# Patient Record
Sex: Female | Born: 1980 | Race: White | Hispanic: No | Marital: Single
Health system: Southern US, Community
[De-identification: ages and names within clinical notes are randomized; demographics above are authoritative.]

---
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
## 2012-04-18 ENCOUNTER — Emergency Department (INDEPENDENT_AMBULATORY_CARE_PROVIDER_SITE_OTHER)
Admission: EM | Admit: 2012-04-18 | Discharge: 2012-04-18 | Disposition: A | Discharge status: Home / Self Care | Payer: Self-pay | Source: Home / Self Care | Attending: Emergency Medicine | Admitting: Emergency Medicine

## 2012-04-18 ENCOUNTER — Encounter (HOSPITAL_COMMUNITY): Payer: Self-pay

## 2012-04-18 DIAGNOSIS — R197 Diarrhea, unspecified: Secondary | ICD-10-CM

## 2012-04-18 DIAGNOSIS — R1084 Generalized abdominal pain: Secondary | ICD-10-CM

## 2012-04-18 LAB — CBC WITH DIFFERENTIAL/PLATELET
Basophils Absolute: 0 10*3/uL (ref 0.0–0.1)
Eosinophils Absolute: 0.2 10*3/uL (ref 0.0–0.7)
Eosinophils Relative: 3 % (ref 0–5)
Lymphocytes Relative: 38 % (ref 12–46)
Lymphs Abs: 1.8 10*3/uL (ref 0.7–4.0)
MCV: 83.8 fL (ref 78.0–100.0)
Neutrophils Relative %: 51 % (ref 43–77)
Platelets: 209 10*3/uL (ref 150–400)
RBC: 4.58 MIL/uL (ref 3.87–5.11)
RDW: 12.5 % (ref 11.5–15.5)
WBC: 4.8 10*3/uL (ref 4.0–10.5)

## 2012-04-18 LAB — POCT URINALYSIS DIP (DEVICE)
Hgb urine dipstick: NEGATIVE
Ketones, ur: NEGATIVE mg/dL
Protein, ur: NEGATIVE mg/dL
Specific Gravity, Urine: 1.02 (ref 1.005–1.030)
Urobilinogen, UA: 0.2 mg/dL (ref 0.0–1.0)
pH: 6 (ref 5.0–8.0)

## 2012-04-18 LAB — POCT I-STAT, CHEM 8
BUN: 9 mg/dL (ref 6–23)
Calcium, Ion: 1.24 mmol/L — ABNORMAL HIGH (ref 1.12–1.23)
Creatinine, Ser: 0.8 mg/dL (ref 0.50–1.10)
Hemoglobin: 13.6 g/dL (ref 12.0–15.0)
Sodium: 140 mEq/L (ref 135–145)
TCO2: 25 mmol/L (ref 0–100)

## 2012-04-18 LAB — COMPREHENSIVE METABOLIC PANEL
AST: 15 U/L (ref 0–37)
BUN: 10 mg/dL (ref 6–23)
CO2: 28 mEq/L (ref 19–32)
Calcium: 9.3 mg/dL (ref 8.4–10.5)
Chloride: 103 mEq/L (ref 96–112)
Creatinine, Ser: 0.66 mg/dL (ref 0.50–1.10)
GFR calc Af Amer: >90 mL/min (ref >90)
GFR calc non Af Amer: >90 mL/min (ref >90)
Glucose, Bld: 84 mg/dL (ref 70–99)
Total Bilirubin: 0.3 mg/dL (ref 0.3–1.2)

## 2012-04-18 MED ORDER — DICYCLOMINE HCL 10 MG PO CAPS: 10.0000 mg | ORAL_CAPSULE | Freq: Three times a day (TID) | ORAL | Status: AC

## 2012-04-18 MED ORDER — DIPHENOXYLATE-ATROPINE 2.5-0.025 MG PO TABS: 1.0000 | ORAL_TABLET | Freq: Four times a day (QID) | ORAL | Status: AC | PRN

## 2012-04-18 NOTE — ED Provider Notes (Addendum)
History     CSN: [id number]  Arrival date & time 04/18/12  1007   First MD Initiated Contact with Patient 04/18/12 1007      Chief Complaint  Patient presents with  . Abdominal Pain    (Consider location/radiation/quality/duration/timing/severity/associated sxs/prior treatment) HPI Comments: Patient presents urgent care complaining of ongoing abdominal cramping pains for about 7 days. She has also been experiencing diarrhea range from soft to liquid stools. Occasionally she does see phlegm or mucoid material denies any blood or rectal pain. Patient feels at this point after 7 days of symptoms she might have  lost a couple pounds. Feels fearful of eating certain foods as she doesn't want to perpetuate or aggravated the diarrhea or the cramps. So she has not been eating very well for the last few days. Patient denies any fevers, recent antibiotic usage, have not traveled internationally further last 6 months. She admits that she have noticed similar instances in which she had had some diarrheas occasionally within the last few months. Denies any bloody diarrhea as and does not recall if she has had cramping pains associated with it. Patient denies any urinary symptoms, vaginal discharge nor expresses any further concerns. Denies any constitutional symptoms such as fevers, malaise, chills, arthralgias, myalgias,  Patient is a 31 y.o. female presenting with abdominal pain. The history is provided by the patient.  Abdominal Pain The primary symptoms of the illness include abdominal pain and diarrhea. The primary symptoms of the illness do not include fever, fatigue, nausea, vomiting, hematemesis, hematochezia, dysuria, vaginal discharge or vaginal bleeding. The current episode started more than 2 days ago. The problem has not changed since onset. The abdominal pain began more than 2 days ago. The abdominal pain has been unchanged since its onset. The abdominal pain is generalized. The abdominal pain  does not radiate. The severity of the abdominal pain is 7/10. The abdominal pain is relieved by nothing.  The illness is associated with eating (SOMETIMES). The patient states that she believes she is currently not pregnant. The patient has had a change in bowel habit. Additional symptoms associated with the illness include anorexia and frequency. Symptoms associated with the illness do not include chills, constipation, urgency or back pain. Significant associated medical issues do not include GERD, inflammatory bowel disease, diabetes, gallstones, liver disease, substance abuse, diverticulitis or cardiac disease.    History reviewed. No pertinent past medical history.  History reviewed. No pertinent past surgical history.  History reviewed. No pertinent family history.  History  Substance Use Topics  . Smoking status: Not on file  . Smokeless tobacco: Not on file  . Alcohol Use: Not on file    OB History    Grav Para Term Preterm Abortions TAB SAB Ect Mult Living                  Review of Systems  Constitutional: Negative for fever, chills, activity change and fatigue.  Gastrointestinal: Positive for abdominal pain, diarrhea and anorexia. Negative for nausea, vomiting, constipation, blood in stool, hematochezia, abdominal distention and hematemesis.  Genitourinary: Positive for frequency. Negative for dysuria, urgency, vaginal bleeding and vaginal discharge.  Musculoskeletal: Negative for back pain.  Skin: Negative for rash.  Neurological: Negative for dizziness.    Allergies  Review of patient's allergies indicates no known allergies.  Home Medications   Current Outpatient Rx  Name Route Sig Dispense Refill  . DIPHENOXYLATE-ATROPINE 2.5-0.025 MG PO TABS Oral Take 1 tablet by mouth 4 (four) times daily as  needed for diarrhea or loose stools. 15 tablet 0    BP 127/79  Pulse 62  Temp 98.8 F (37.1 C) (Oral)  Resp 18  SpO2 99%  Physical Exam  Nursing note and vitals  reviewed. Constitutional: Vital signs are normal. She appears well-developed and well-nourished.  Non-toxic appearance. She does not have a sickly appearance. She appears ill. No distress.  Eyes: No scleral icterus.  Pulmonary/Chest: Effort normal.  Abdominal: Soft. Bowel sounds are normal. She exhibits no distension and no mass. There is no hepatosplenomegaly or hepatomegaly. There is no tenderness. There is no rigidity, no rebound, no guarding, no CVA tenderness, no tenderness at McBurney's point and negative Murphy's sign.    Skin: Skin is warm. No rash noted. No erythema.    ED Course  Procedures (including critical care time)  Labs Reviewed  POCT I-STAT, CHEM 8 - Abnormal; Notable for the following:    Calcium, Ion 1.24 (*)     All other components within normal limits  POCT URINALYSIS DIP (DEVICE)  POCT PREGNANCY, URINE  COMPREHENSIVE METABOLIC PANEL  LIPASE, BLOOD  CBC WITH DIFFERENTIAL  STOOL CULTURE   No results found.   1. Abdominal pain, generalized   2. Diarrhea       MDM  Patient presents today with gastrointestinal symptoms that consist of intermittent recurrent abdominal cramping pains (colickly in nature). Nonbloody diarrheas, with a normal abdominal exam. Patient relates during exam and interview that she does not feel any pain at all during evaluation. Patient might have a numerous conditions from inflammatory bowel illness, functional (IBS) process or gastroenteritis We have done some preliminary laboratory workup that included stool studies to rule out infectious processes and electrolyte disturbances. We will treat patient symptomatically for the next 5-7 days and had been encouraged to followup with the gastroenterologist a number was provided to her urine or encounter for her to call in case her symptoms persisted. We also discussed symptoms that will warrant further evaluation in the emergency department .she agrees with treatment plan and followup care as  necessary. I have advised her that we will contact her if any abnormal test results.      Jimmie Molly, MD 04/18/12 1158  Jimmie Molly, MD 04/18/12 1610  Jimmie Molly, MD 04/18/12 1209

## 2012-04-18 NOTE — ED Notes (Signed)
C/o 1 week general abdominal pain , cramps, nausea; denies vomiting, diarrhea; pain comes and goes (currently "1" , can be as much as "10" at times) NAD at present, denies STD concerns

## 2012-04-22 LAB — STOOL CULTURE

## 2020-01-02 ENCOUNTER — Ambulatory Visit: Payer: Self-pay | Attending: Internal Medicine

## 2020-01-02 DIAGNOSIS — Z23 Encounter for immunization: Secondary | ICD-10-CM

## 2020-01-02 NOTE — Progress Notes (Signed)
   Covid-19 Vaccination Clinic  Name:  Virjean Boman    MRN: [id number] DOB: Oct 16, 1980  01/02/2020  Ms. Mander was observed post Covid-19 immunization for 15 minutes without incident. She was provided with Vaccine Information Sheet and instruction to access the V-Safe system.   Ms. Alfonzo was instructed to call 911 with any severe reactions post vaccine: Marland Kitchen Difficulty breathing  . Swelling of face and throat  . A fast heartbeat  . A bad rash all over body  . Dizziness and weakness   Immunizations Administered    Name Date Dose VIS Date Route   Pfizer COVID-19 Vaccine 01/02/2020 10:30 AM 0.3 mL 10/07/2018 Intramuscular   Manufacturer: [employer], [employer]   Lot: [id number]   NDC: [address]-6

## 2020-01-25 ENCOUNTER — Ambulatory Visit: Payer: Self-pay | Attending: Internal Medicine

## 2020-01-25 DIAGNOSIS — Z23 Encounter for immunization: Secondary | ICD-10-CM

## 2020-01-25 NOTE — Progress Notes (Signed)
° °  Covid-19 Vaccination Clinic  Name:  Carmyn Hamm    MRN: [id number] DOB: 29-Nov-1980  01/25/2020  Ms. Rivest was observed post Covid-19 immunization for 15 minutes without incident. She was provided with Vaccine Information Sheet and instruction to access the V-Safe system.   Ms. Nordmeyer was instructed to call 911 with any severe reactions post vaccine:  Difficulty breathing   Swelling of face and throat   A fast heartbeat   A bad rash all over body   Dizziness and weakness   Immunizations Administered    Name Date Dose VIS Date Route   Pfizer COVID-19 Vaccine 01/25/2020  8:54 AM 0.3 mL 10/07/2018 Intramuscular   Manufacturer: [employer], [employer]   Lot: [id number]   NDC: [address]-5

## 2020-02-17 ENCOUNTER — Other Ambulatory Visit: Payer: Self-pay

## 2020-02-17 ENCOUNTER — Ambulatory Visit (INDEPENDENT_AMBULATORY_CARE_PROVIDER_SITE_OTHER): Payer: No Payment, Other | Admitting: Clinical

## 2020-02-17 DIAGNOSIS — F330 Major depressive disorder, recurrent, mild: Secondary | ICD-10-CM | POA: Diagnosis not present

## 2020-02-20 DIAGNOSIS — F330 Major depressive disorder, recurrent, mild: Secondary | ICD-10-CM | POA: Insufficient documentation
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
## 2020-02-24 ENCOUNTER — Other Ambulatory Visit: Payer: Self-pay

## 2020-02-24 ENCOUNTER — Telehealth (HOSPITAL_COMMUNITY): Payer: BLUE CROSS/BLUE SHIELD | Admitting: Psychiatric/Mental Health

## 2021-02-13 ENCOUNTER — Ambulatory Visit (INDEPENDENT_AMBULATORY_CARE_PROVIDER_SITE_OTHER): Payer: Self-pay

## 2021-02-13 ENCOUNTER — Ambulatory Visit
Admission: RE | Admit: 2021-02-13 | Discharge: 2021-02-13 | Disposition: A | Discharge status: Home / Self Care | Payer: Self-pay | Source: Ambulatory Visit | Attending: Emergency Medicine | Admitting: Emergency Medicine

## 2021-02-13 ENCOUNTER — Other Ambulatory Visit: Payer: Self-pay

## 2021-02-13 VITALS — BP 113/61 | HR 77 | Temp 98.5°F | Resp 20

## 2021-02-13 DIAGNOSIS — S60221A Contusion of right hand, initial encounter: Secondary | ICD-10-CM

## 2021-02-13 DIAGNOSIS — S63501A Unspecified sprain of right wrist, initial encounter: Secondary | ICD-10-CM

## 2021-02-13 DIAGNOSIS — M79641 Pain in right hand: Secondary | ICD-10-CM

## 2021-02-13 MED ORDER — IBUPROFEN 800 MG PO TABS: 800.0000 mg | ORAL_TABLET | Freq: Three times a day (TID) | ORAL | 0 refills | Status: AC

## 2021-02-13 NOTE — Discharge Instructions (Addendum)
Xray negative for fracture Use anti-inflammatories for pain/swelling. You may take up to 800 mg Ibuprofen every 8 hours with food. You may supplement Ibuprofen with Tylenol [phone] mg every 8 hours.  Ice Brace to limit movement at wrist Follow up if not improving or worsening

## 2021-02-13 NOTE — ED Provider Notes (Signed)
[facility]    CSN: [id number] Arrival date & time: 02/13/21  1043      History   Chief Complaint Chief Complaint  Patient presents with   Appointment    1100   Hand Pain    HPI Kelly Palmer is a 40 y.o. female presenting today for right hand injury.  Reports crushing hand between cart and counter 2 days ago.  Since has had pain radiating into fingers and forearm.  Pain overall has improved, but continues to have discomfort.  Denies prior fracture.  HPI  History reviewed. No pertinent past medical history.  Patient Active Problem List   Diagnosis Date Noted   Major depressive disorder, recurrent episode, mild (HCC) 02/20/2020    History reviewed. No pertinent surgical history.  OB History   No obstetric history on file.      Home Medications    Prior to Admission medications   Medication Sig Start Date End Date Taking? Authorizing Provider  ibuprofen (ADVIL) 800 MG tablet Take 1 tablet (800 mg total) by mouth 3 (three) times daily. 02/13/21  Yes Costella Schwarz C, PA-C  dicyclomine (BENTYL) 10 MG capsule Take 1 capsule (10 mg total) by mouth 4 (four) times daily -  before meals and at bedtime. 04/18/12 04/18/13  Kelly Molly, MD    Family History Family History  Family history unknown: Yes    Social History     Allergies   Patient has no known allergies.   Review of Systems Review of Systems  Constitutional:  Negative for fatigue and fever.  HENT:  Negative for mouth sores.   Eyes:  Negative for visual disturbance.  Respiratory:  Negative for shortness of breath.   Cardiovascular:  Negative for chest pain.  Gastrointestinal:  Negative for abdominal pain, nausea and vomiting.  Genitourinary:  Negative for genital sores.  Musculoskeletal:  Positive for joint swelling. Negative for arthralgias.  Skin:  Positive for color change. Negative for rash and wound.  Neurological:  Negative for dizziness, weakness, light-headedness and headaches.     Physical Exam Triage Vital Signs ED Triage Vitals [02/13/21 1052]  Enc Vitals Group     BP      Pulse      Resp      Temp      Temp src      SpO2      Weight      Height      Head Circumference      Peak Flow      Pain Score 6     Pain Loc      Pain Edu?      Excl. in GC?    No data found.  Updated Vital Signs BP 113/61 (BP Location: Left Arm)   Pulse 77   Temp 98.5 F (36.9 C) (Oral)   Resp 20   SpO2 96%   Visual Acuity Right Eye Distance:   Left Eye Distance:   Bilateral Distance:    Right Eye Near:   Left Eye Near:    Bilateral Near:     Physical Exam Vitals and nursing note reviewed.  Constitutional:      Appearance: She is well-developed.     Comments: No acute distress  HENT:     Head: Normocephalic and atraumatic.     Nose: Nose normal.  Eyes:     Conjunctiva/sclera: Conjunctivae normal.  Cardiovascular:     Rate and Rhythm: Normal rate.  Pulmonary:  Effort: Pulmonary effort is normal. No respiratory distress.  Abdominal:     General: There is no distension.  Musculoskeletal:        General: Normal range of motion.     Cervical back: Neck supple.     Comments: Right wrist/hand: Mild swelling and distal forearm/wrist and dorsum of hand compared to left, tenderness to palpation along distal radius extending into carpal area and second through fourth metacarpals, no snuffbox tenderness, pain more focal over third metacarpal Radial pulse 2+, sensation intact distally  Skin:    General: Skin is warm and dry.  Neurological:     Mental Status: She is alert and oriented to person, place, and time.     UC Treatments / Results  Labs (all labs ordered are listed, but only abnormal results are displayed) Labs Reviewed - No data to display  EKG   Radiology DG Hand Complete Right  Result Date: 02/13/2021 CLINICAL DATA:  Blunt trauma EXAM: RIGHT HAND - COMPLETE 3+ VIEW COMPARISON:  None. FINDINGS: No evidence of fracture of the carpal or  metacarpal bones. Radiocarpal joint is intact. Phalanges are normal. No soft tissue injury. IMPRESSION: No fracture or dislocation. Electronically Signed   By: Genevive Bi M.D.   On: 02/13/2021 11:27    Procedures Procedures (including critical care time)  Medications Ordered in UC Medications - No data to display  Initial Impression / Assessment and Plan / UC Course  I have reviewed the triage vital signs and the nursing notes.  Pertinent labs & imaging results that were available during my care of the patient were reviewed by me and considered in my medical decision making (see chart for details).     Right hand contusion/right wrist sprain-x-ray negative for fracture, placing in wrist brace to limit movement at wrist and recommending anti-inflammatories ice and monitor for gradual resolution return to normal use of right hand and wrist.  No snuffbox tenderness, do not suspect scaphoid fracture.  Follow-up if not improving or worsening.  Discussed strict return precautions. Patient verbalized understanding and is agreeable with plan.  Final Clinical Impressions(s) / UC Diagnoses   Final diagnoses:  Contusion of right hand, initial encounter  Sprain of right wrist, initial encounter     Discharge Instructions      Xray negative for fracture Use anti-inflammatories for pain/swelling. You may take up to 800 mg Ibuprofen every 8 hours with food. You may supplement Ibuprofen with Tylenol [phone] mg every 8 hours.  Ice Brace to limit movement at wrist Follow up if not improving or worsening     ED Prescriptions     Medication Sig Dispense Auth. Provider   ibuprofen (ADVIL) 800 MG tablet Take 1 tablet (800 mg total) by mouth 3 (three) times daily. 21 tablet Shaneese Tait, [place] C, PA-C      PDMP not reviewed this encounter.   Lew Dawes, PA-C 02/13/21 1139
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
## 2022-07-16 IMAGING — DX DG HAND COMPLETE 3+V*R*
3 series · 3 of 3 positions shown · non-contrast
Comparison: None.

CLINICAL DATA: Blunt trauma

EXAM:
RIGHT HAND - COMPLETE 3+ VIEW

[hand pa]
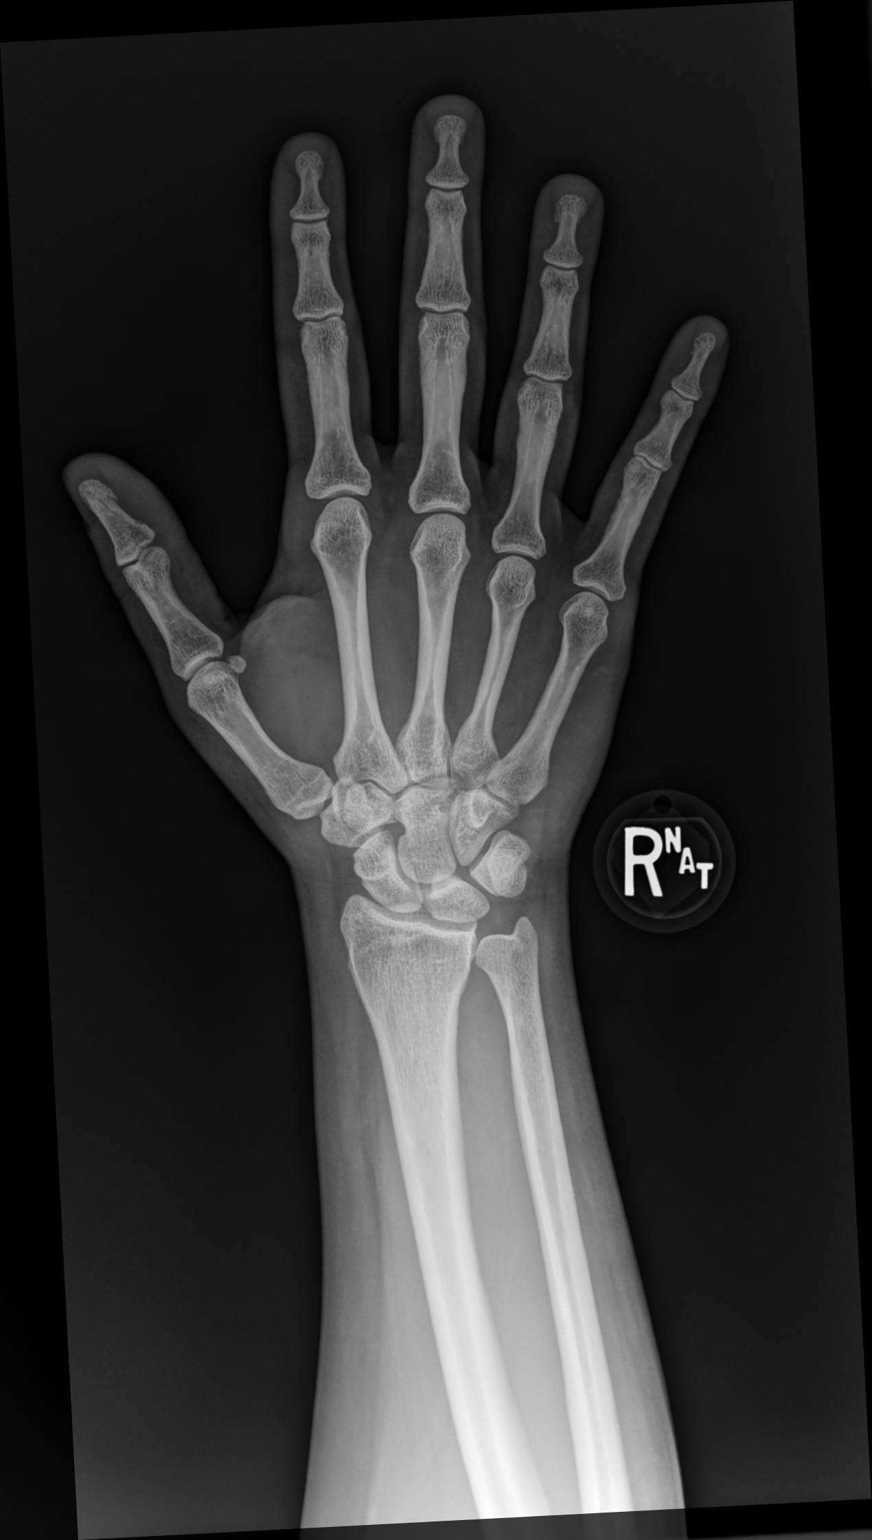

[hand mlo]
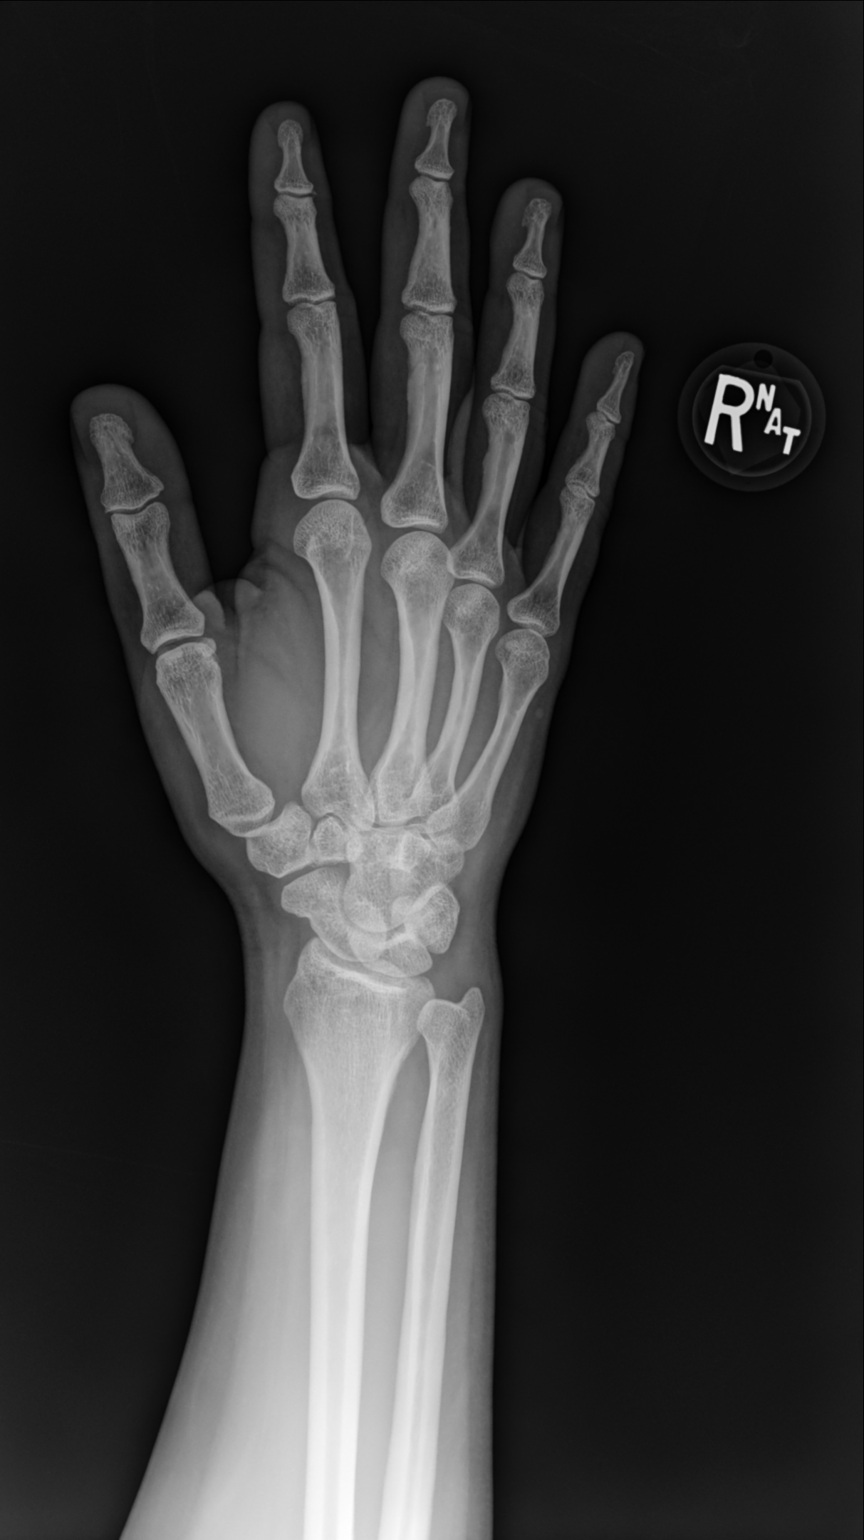

[hand lat]
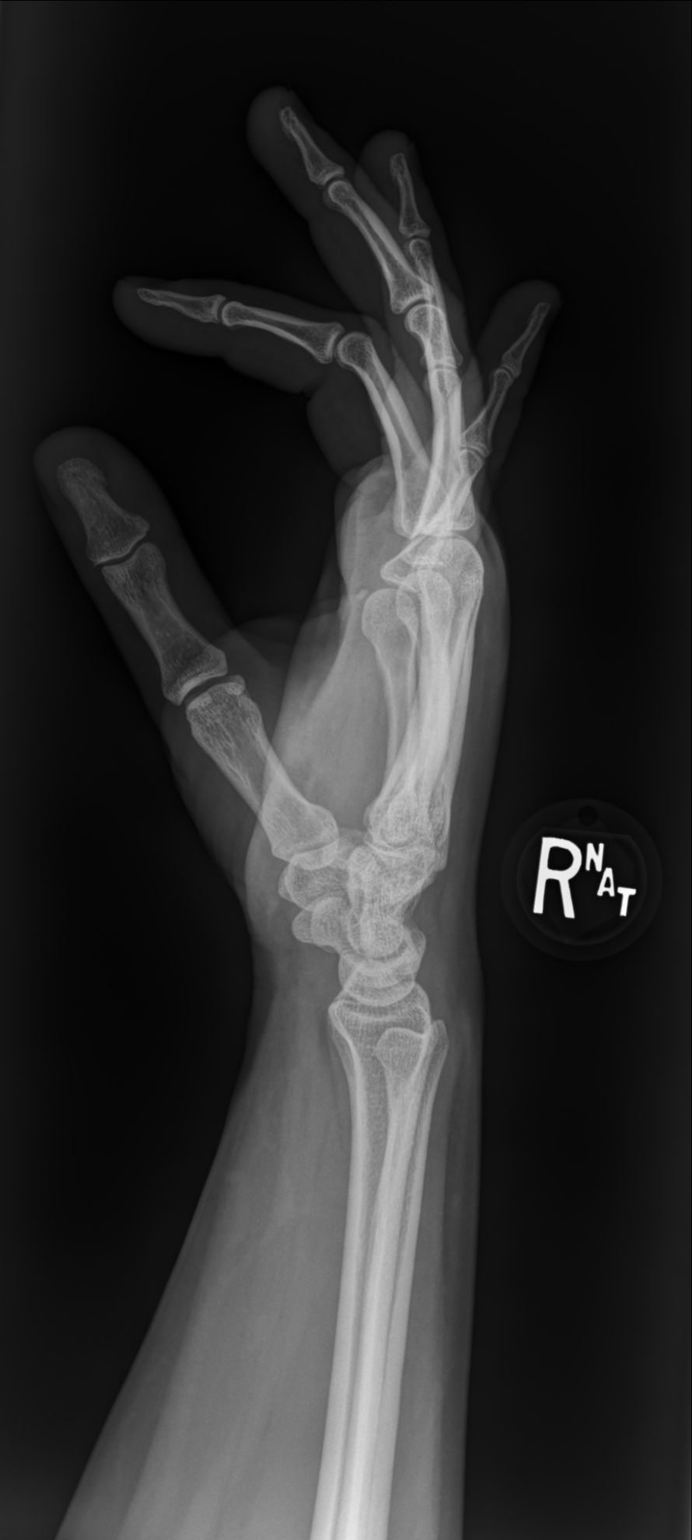

[3 of 3 positions shown; findings below may reference images not displayed]

FINDINGS: No evidence of fracture of the carpal or metacarpal bones.
Radiocarpal joint is intact. Phalanges are normal. No soft tissue
injury.
IMPRESSION: No fracture or dislocation.

## 2023-06-23 ENCOUNTER — Other Ambulatory Visit: Payer: Self-pay

## 2023-06-23 ENCOUNTER — Encounter (HOSPITAL_COMMUNITY): Payer: Self-pay | Admitting: Emergency Medicine

## 2023-06-23 ENCOUNTER — Emergency Department (HOSPITAL_COMMUNITY)
Admission: EM | Admit: 2023-06-23 | Discharge: 2023-06-24 | Disposition: A | Discharge status: Home / Self Care | Payer: Self-pay | Attending: Emergency Medicine | Admitting: Emergency Medicine

## 2023-06-23 ENCOUNTER — Emergency Department (HOSPITAL_COMMUNITY): Payer: Self-pay

## 2023-06-23 DIAGNOSIS — E871 Hypo-osmolality and hyponatremia: Secondary | ICD-10-CM | POA: Insufficient documentation

## 2023-06-23 DIAGNOSIS — I2693 Single subsegmental pulmonary embolism without acute cor pulmonale: Secondary | ICD-10-CM | POA: Insufficient documentation

## 2023-06-23 DIAGNOSIS — K625 Hemorrhage of anus and rectum: Secondary | ICD-10-CM | POA: Insufficient documentation

## 2023-06-23 DIAGNOSIS — R1084 Generalized abdominal pain: Secondary | ICD-10-CM | POA: Insufficient documentation

## 2023-06-23 LAB — COMPREHENSIVE METABOLIC PANEL
ALT: 22 U/L (ref 0–44)
AST: 19 U/L (ref 15–41)
Albumin: 4.5 g/dL (ref 3.5–5.0)
Alkaline Phosphatase: 86 U/L (ref 38–126)
Anion gap: 10 (ref 5–15)
BUN: 16 mg/dL (ref 6–20)
CO2: 25 mmol/L (ref 22–32)
Calcium: 9.3 mg/dL (ref 8.9–10.3)
Chloride: 98 mmol/L (ref 98–111)
Creatinine, Ser: 0.86 mg/dL (ref 0.44–1.00)
GFR, Estimated: >60 mL/min (ref >60)
Glucose, Bld: 111 mg/dL — ABNORMAL HIGH (ref 70–99)
Potassium: 3.6 mmol/L (ref 3.5–5.1)
Sodium: 133 mmol/L — ABNORMAL LOW (ref 135–145)
Total Bilirubin: 0.4 mg/dL (ref <1.2)
Total Protein: 7.5 g/dL (ref 6.5–8.1)

## 2023-06-23 LAB — CBC
HCT: 40.4 % (ref 36.0–46.0)
Hemoglobin: 13.2 g/dL (ref 12.0–15.0)
MCH: 27.8 pg (ref 26.0–34.0)
MCHC: 32.7 g/dL (ref 30.0–36.0)
MCV: 85.2 fL (ref 80.0–100.0)
Platelets: 302 10*3/uL (ref 150–400)
RBC: 4.74 MIL/uL (ref 3.87–5.11)
RDW: 13.2 % (ref 11.5–15.5)
WBC: 7.6 10*3/uL (ref 4.0–10.5)
nRBC: 0 % (ref 0.0–0.2)

## 2023-06-23 LAB — POC OCCULT BLOOD, ED: Fecal Occult Bld: NEGATIVE

## 2023-06-23 LAB — ABO/RH: ABO/RH(D): O POS

## 2023-06-23 LAB — HCG, SERUM, QUALITATIVE: Preg, Serum: NEGATIVE

## 2023-06-23 MED ORDER — IOHEXOL 350 MG/ML SOLN
100.0000 mL | Freq: Once | INTRAVENOUS | Status: AC | PRN
  Administered 2023-06-23: 100 mL via INTRAVENOUS

## 2023-06-23 NOTE — ED Triage Notes (Signed)
Pt reports rectal bleeding x a few days. Pt states today it has been only blood. Also reports abd pain and some nausea.

## 2023-06-23 NOTE — ED Notes (Signed)
2nd collect of blood work sent.

## 2023-06-23 NOTE — ED Provider Notes (Signed)
[facility]ENCY DEPT [facility] Emergency Department Provider Note MRN:  [id number]  Arrival date & time: 06/24/23     Chief Complaint   Rectal Bleeding   History of Present Illness   Kelly Palmer is a 42 y.o. year-old female presents to the ED with chief complaint of rectal bleeding.  She reports 3 bloody bowel movements.  States that she has been having some bleeding for the past week.  States that the toilet bowl is bright red.  States that there doesn't seem to be much stool, but the stool that does come is normal in color and in texture.  Reports associated nausea. Reports mild to moderate abdominal pain.  The pain comes and goes.  Denies fevers or chills.  Denies hx of the same.  Denies vaginal bleeding or discharge.  Reports hx of external hemorrhoids.  History provided by patient.   Review of Systems  Pertinent positive and negative review of systems noted in HPI.    Physical Exam   Vitals:   06/24/23 0055 06/24/23 0300  BP: 117/75 128/88  Pulse: 65 63  Resp: 16   Temp: 97.9 F (36.6 C)   SpO2: 95% 95%    CONSTITUTIONAL:  well-appearing, NAD NEURO:  Alert and oriented x 3, CN 3-12 grossly intact EYES:  eyes equal and reactive ENT/NECK:  Supple, no stridor  CARDIO:  normal rate, regular rhythm, appears well-perfused  PULM:  No respiratory distress, CTAB GI/GU:  non-distended, Chaperone present for exam, external hemorrhoids present, no bleeding noted, there are some palpable internal hemorrhoids, but no bleeding noted, generalized lower abdominal tenderness MSK/SPINE:  No gross deformities, no edema, moves all extremities  SKIN:  no rash, atraumatic   *Additional and/or pertinent findings included in MDM below  Diagnostic and Interventional Summary    EKG Interpretation Date/Time:    Ventricular Rate:    PR Interval:    QRS Duration:    QT Interval:    QTC Calculation:   R Axis:      Text Interpretation:         Labs Reviewed  COMPREHENSIVE  METABOLIC PANEL - Abnormal; Notable for the following components:      Result Value   Sodium 133 (*)    Glucose, Bld 111 (*)    All other components within normal limits  CBC  HCG, SERUM, QUALITATIVE  POC OCCULT BLOOD, ED  POC OCCULT BLOOD, ED  TYPE AND SCREEN  ABO/RH    CT Angio Chest PE W and/or Wo Contrast  Final Result    CT Angio Abd/Pel W and/or Wo Contrast  Final Result    LE VENOUS    (Results Pending)    Medications  iohexol (OMNIPAQUE) 350 MG/ML injection 100 mL (100 mLs Intravenous Contrast Given 06/23/23 2317)  iohexol (OMNIPAQUE) 350 MG/ML injection 75 mL (75 mLs Intravenous Contrast Given 06/24/23 0340)     Procedures  /  Critical Care Procedures  ED Course and Medical Decision Making  I have reviewed the triage vital signs, the nursing notes, and pertinent available records from the EMR.  Social Determinants Affecting Complexity of Care: Patient has no clinically significant social determinants affecting this chief complaint..   ED Course: Clinical Course as of 06/24/23 0500  Mon Jun 24, 2023  4034 CT Angio Abd/Pel W and/or Wo Contrast Subsegmental PE seen in right lower lobe, no extravasation or evidence of acute GI bleed  [RB]  0324 CBC No anemia or leukocytosis [RB]  0325 Comprehensive metabolic panel(!) Mild  hyponatremia, no other significant electrolyte derangement. [RB]  0325 POC occult blood, ED Hemoccult negative, no gross blood on DRE, there were internal and external hemorrhoids palpated. [RB]    Clinical Course User Index [RB] Roxy Horseman, PA-C    Medical Decision Making Patient here after having several episodes of rectal bleeding.  She does have known hemorrhoids.  Physical exam is notable for external and internal hemorrhoids, but no gross blood.  Hemoccult was negative.  Will check CT angio abdomen/pelvis to look for active bleeding and/or colitis or diverticulitis.  Blood counts are stable.  No significant anemia.  Vitals are  stable.  CT scan negative for signs of bleeding in the abdomen, negative for signs of infection.  There was a small subsegmental PE seen incidentally.  We proceeded with a CT chest to ensure that there was no larger clots.  No further clot burden was seen.  We are unable to perform lower extremity Doppler ultrasounds this hour.  I offered the patient the studies it a few hours versus going home and returning.  She would like to go home and return.  We also discussed that she will need to have hypercoagulability workup performed by her PCP.  At this time, we will hold anticoagulation given that she had some GI bleeding and is not having any symptoms whatsoever from this small subsegmental PE.  She is not hypoxic, not tachycardic.  She denies any chest pain or shortness of breath.  Denies any exertional symptoms.  Her PESI score is 39, indicating very low risk of 30-day mortality.  She has good follow-up with her PCP.  Return precautions discussed.  I have recommended high-fiber diet and increased hydration for her rectal bleeding/hemorrhoids.  Amount and/or Complexity of Data Reviewed Labs: ordered. Decision-making details documented in ED Course. Radiology: ordered. Decision-making details documented in ED Course.  Risk Prescription drug management.         Consultants: No consultations were needed in caring for this patient.   Treatment and Plan: I considered admission due to patient's initial presentation, but after considering the examination and diagnostic results, patient will not require admission and can be discharged with outpatient follow-up.  Patient discussed with attending physician, Dr. Eudelia Bunch, who agrees with plan for CT PE study.  I discussed these recommendations with the patient, who is also agreeable.  Final Clinical Impressions(s) / ED Diagnoses     ICD-10-CM   1. Rectal bleeding  K62.5     2. Single subsegmental pulmonary embolism without acute cor pulmonale [facility])   I26.93       ED Discharge Orders          Ordered    LE VENOUS        06/24/23 0455              Discharge Instructions Discussed with and Provided to Patient:     Discharge Instructions      Your workup tonight showed now evidence of active GI bleed on the CT scans.  The bleeding you saw is most likely from internal hemorrhoids.  I recommend increasing the fiber in your diet.   The CT scan had an incidental finding of a small blood clot in the lung.  You need to have an ultrasound of your legs to look for any other clots.  If any clots are found, you'll likely need to take a blood thinner.    Due to the recent rectal bleeding and because you're not having any symptoms from  the blood clot, we are not going to start a blood thinner on you now as we discussed.  You need to follow-up with your doctor and have a hypercoagulability workup done.  Return for new or worsening symptoms.       Roxy Horseman, PA-C 06/24/23 6195    Nira Conn, MD 06/24/23 [phone]

## 2023-06-24 ENCOUNTER — Ambulatory Visit (HOSPITAL_COMMUNITY)
Admission: RE | Admit: 2023-06-24 | Discharge: 2023-06-24 | Disposition: A | Discharge status: Home / Self Care | Payer: Self-pay | Source: Ambulatory Visit | Attending: Emergency Medicine | Admitting: Emergency Medicine

## 2023-06-24 ENCOUNTER — Emergency Department (HOSPITAL_COMMUNITY): Payer: Self-pay

## 2023-06-24 DIAGNOSIS — M7989 Other specified soft tissue disorders: Secondary | ICD-10-CM | POA: Insufficient documentation

## 2023-06-24 LAB — TYPE AND SCREEN
ABO/RH(D): O POS
Antibody Screen: NEGATIVE

## 2023-06-24 MED ORDER — IOHEXOL 350 MG/ML SOLN
75.0000 mL | Freq: Once | INTRAVENOUS | Status: AC | PRN
  Administered 2023-06-24: 75 mL via INTRAVENOUS

## 2023-06-24 NOTE — Discharge Instructions (Signed)
Your workup tonight showed now evidence of active GI bleed on the CT scans.  The bleeding you saw is most likely from internal hemorrhoids.  I recommend increasing the fiber in your diet.   The CT scan had an incidental finding of a small blood clot in the lung.  You need to have an ultrasound of your legs to look for any other clots.  If any clots are found, you'll likely need to take a blood thinner.    Due to the recent rectal bleeding and because you're not having any symptoms from the blood clot, we are not going to start a blood thinner on you now as we discussed.  You need to follow-up with your doctor and have a hypercoagulability workup done.  Return for new or worsening symptoms.

## 2023-06-24 NOTE — Progress Notes (Signed)
Lower extremity venous duplex completed. Please see CV Procedures for preliminary results.  Shona Simpson, RVT 06/24/23 11:51 AM
